# Patient Record
Sex: Female | Born: 1945 | Race: White | Hispanic: No | Marital: Single | Smoking: Never smoker
Health system: Southern US, Community
[De-identification: ages and names within clinical notes are randomized; demographics above are authoritative.]

## PROBLEM LIST (undated history)

## (undated) DIAGNOSIS — I499 Cardiac arrhythmia, unspecified: Secondary | ICD-10-CM

## (undated) DIAGNOSIS — K219 Gastro-esophageal reflux disease without esophagitis: Secondary | ICD-10-CM

## (undated) DIAGNOSIS — M810 Age-related osteoporosis without current pathological fracture: Secondary | ICD-10-CM

## (undated) HISTORY — PX: TONSILLECTOMY: SUR1361

## (undated) HISTORY — PX: COLONOSCOPY: SHX174

## (undated) HISTORY — PX: EYE SURGERY: SHX253

---

## 2004-07-26 ENCOUNTER — Ambulatory Visit: Payer: Self-pay | Admitting: Internal Medicine

## 2004-08-13 ENCOUNTER — Ambulatory Visit: Payer: Self-pay | Admitting: Internal Medicine

## 2005-08-14 ENCOUNTER — Ambulatory Visit: Payer: Self-pay | Admitting: Internal Medicine

## 2005-08-26 ENCOUNTER — Ambulatory Visit: Payer: Self-pay | Admitting: Internal Medicine

## 2006-08-28 ENCOUNTER — Ambulatory Visit: Payer: Self-pay | Admitting: Internal Medicine

## 2006-09-25 ENCOUNTER — Ambulatory Visit: Payer: Self-pay | Admitting: Gastroenterology

## 2007-08-31 ENCOUNTER — Ambulatory Visit: Payer: Self-pay | Admitting: Internal Medicine

## 2008-09-02 ENCOUNTER — Ambulatory Visit: Payer: Self-pay | Admitting: Internal Medicine

## 2008-10-05 ENCOUNTER — Ambulatory Visit: Payer: Self-pay | Admitting: Internal Medicine

## 2009-09-04 ENCOUNTER — Ambulatory Visit: Payer: Self-pay | Admitting: Internal Medicine

## 2009-09-07 ENCOUNTER — Ambulatory Visit: Payer: Self-pay | Admitting: Internal Medicine

## 2010-09-12 ENCOUNTER — Ambulatory Visit: Payer: Self-pay | Admitting: Internal Medicine

## 2011-09-13 ENCOUNTER — Ambulatory Visit: Payer: Self-pay | Admitting: Internal Medicine

## 2012-09-14 ENCOUNTER — Ambulatory Visit: Payer: Self-pay | Admitting: Internal Medicine

## 2013-09-20 ENCOUNTER — Ambulatory Visit: Payer: Self-pay | Admitting: Family Medicine

## 2014-09-27 ENCOUNTER — Other Ambulatory Visit: Payer: Self-pay | Admitting: Family Medicine

## 2014-09-27 DIAGNOSIS — Z1231 Encounter for screening mammogram for malignant neoplasm of breast: Secondary | ICD-10-CM

## 2014-10-03 ENCOUNTER — Ambulatory Visit
Admission: RE | Admit: 2014-10-03 | Discharge: 2014-10-03 | Disposition: A | Discharge status: Home / Self Care | Payer: Medicare Other | Source: Ambulatory Visit | Attending: Family Medicine | Admitting: Family Medicine

## 2014-10-03 ENCOUNTER — Other Ambulatory Visit: Payer: Self-pay | Admitting: Family Medicine

## 2014-10-03 DIAGNOSIS — Z1231 Encounter for screening mammogram for malignant neoplasm of breast: Secondary | ICD-10-CM | POA: Diagnosis not present

## 2015-08-25 ENCOUNTER — Other Ambulatory Visit: Payer: Self-pay | Admitting: Family Medicine

## 2015-08-25 DIAGNOSIS — Z1231 Encounter for screening mammogram for malignant neoplasm of breast: Secondary | ICD-10-CM

## 2015-10-04 ENCOUNTER — Ambulatory Visit
Admission: RE | Admit: 2015-10-04 | Discharge: 2015-10-04 | Disposition: A | Discharge status: Home / Self Care | Payer: Medicare Other | Source: Ambulatory Visit | Attending: Family Medicine | Admitting: Family Medicine

## 2015-10-04 ENCOUNTER — Ambulatory Visit: Payer: Medicare Other

## 2015-10-04 ENCOUNTER — Other Ambulatory Visit: Payer: Self-pay | Admitting: Family Medicine

## 2015-10-04 DIAGNOSIS — Z1231 Encounter for screening mammogram for malignant neoplasm of breast: Secondary | ICD-10-CM

## 2015-10-04 DIAGNOSIS — R928 Other abnormal and inconclusive findings on diagnostic imaging of breast: Secondary | ICD-10-CM | POA: Diagnosis not present

## 2015-10-05 ENCOUNTER — Other Ambulatory Visit: Payer: Self-pay | Admitting: Family Medicine

## 2015-10-05 DIAGNOSIS — N6320 Unspecified lump in the left breast, unspecified quadrant: Secondary | ICD-10-CM

## 2015-10-17 ENCOUNTER — Ambulatory Visit
Admission: RE | Admit: 2015-10-17 | Discharge: 2015-10-17 | Disposition: A | Discharge status: Home / Self Care | Payer: Medicare Other | Source: Ambulatory Visit | Attending: Family Medicine | Admitting: Family Medicine

## 2015-10-17 DIAGNOSIS — N63 Unspecified lump in breast: Secondary | ICD-10-CM | POA: Insufficient documentation

## 2015-10-17 DIAGNOSIS — N6320 Unspecified lump in the left breast, unspecified quadrant: Secondary | ICD-10-CM

## 2015-10-19 ENCOUNTER — Other Ambulatory Visit: Payer: Medicare Other

## 2015-10-19 ENCOUNTER — Ambulatory Visit: Payer: Medicare Other

## 2016-08-02 NOTE — Discharge Instructions (Signed)

## 2016-08-06 ENCOUNTER — Ambulatory Visit
Admission: RE | Admit: 2016-08-06 | Discharge: 2016-08-06 | Disposition: A | Discharge status: Home / Self Care | Payer: Medicare Other | Source: Ambulatory Visit | Attending: Ophthalmology | Admitting: Ophthalmology

## 2016-08-06 ENCOUNTER — Encounter
Admission: RE | Disposition: A | Discharge status: Home / Self Care | Payer: Self-pay | Source: Ambulatory Visit | Attending: Ophthalmology

## 2016-08-06 ENCOUNTER — Ambulatory Visit: Payer: Medicare Other | Admitting: Anesthesiology

## 2016-08-06 DIAGNOSIS — Z7982 Long term (current) use of aspirin: Secondary | ICD-10-CM | POA: Insufficient documentation

## 2016-08-06 DIAGNOSIS — Z79899 Other long term (current) drug therapy: Secondary | ICD-10-CM | POA: Diagnosis not present

## 2016-08-06 DIAGNOSIS — K219 Gastro-esophageal reflux disease without esophagitis: Secondary | ICD-10-CM | POA: Insufficient documentation

## 2016-08-06 DIAGNOSIS — H2511 Age-related nuclear cataract, right eye: Secondary | ICD-10-CM | POA: Diagnosis present

## 2016-08-06 HISTORY — DX: Age-related osteoporosis without current pathological fracture: M81.0

## 2016-08-06 HISTORY — PX: CATARACT EXTRACTION W/PHACO: SHX586

## 2016-08-06 HISTORY — DX: Gastro-esophageal reflux disease without esophagitis: K21.9

## 2016-08-06 HISTORY — DX: Cardiac arrhythmia, unspecified: I49.9

## 2016-08-06 SURGERY — PHACOEMULSIFICATION, CATARACT, WITH IOL INSERTION
Anesthesia: Monitor Anesthesia Care | Laterality: Right | Wound class: Clean

## 2016-08-06 MED ORDER — ONDANSETRON HCL 4 MG/2ML IJ SOLN: 4.0000 mg | Freq: Once | INTRAMUSCULAR | Status: DC | PRN

## 2016-08-06 MED ORDER — ACETAMINOPHEN 160 MG/5ML PO SOLN: 325.0000 mg | ORAL | Status: DC | PRN

## 2016-08-06 MED ORDER — BALANCED SALT IO SOLN
INTRAOCULAR | Status: DC | PRN
  Administered 2016-08-06: 1 mL via INTRAOCULAR

## 2016-08-06 MED ORDER — SODIUM HYALURONATE 23 MG/ML IO SOLN
INTRAOCULAR | Status: DC | PRN
  Administered 2016-08-06: 0.6 mL via INTRAOCULAR

## 2016-08-06 MED ORDER — ACETAMINOPHEN 325 MG PO TABS: 325.0000 mg | ORAL_TABLET | ORAL | Status: DC | PRN

## 2016-08-06 MED ORDER — MOXIFLOXACIN HCL 0.5 % OP SOLN
OPHTHALMIC | Status: DC | PRN
  Administered 2016-08-06: 0.2 mL via OPHTHALMIC

## 2016-08-06 MED ORDER — SODIUM HYALURONATE 10 MG/ML IO SOLN
INTRAOCULAR | Status: DC | PRN
  Administered 2016-08-06: 0.55 mL via INTRAOCULAR

## 2016-08-06 MED ORDER — MIDAZOLAM HCL 2 MG/2ML IJ SOLN
INTRAMUSCULAR | Status: DC | PRN
  Administered 2016-08-06: 2 mg via INTRAVENOUS

## 2016-08-06 MED ORDER — EPINEPHRINE PF 1 MG/ML IJ SOLN
INTRAOCULAR | Status: DC | PRN
  Administered 2016-08-06: 73 mL via OPHTHALMIC

## 2016-08-06 MED ORDER — ARMC OPHTHALMIC DILATING DROPS
1.0000 "application " | OPHTHALMIC | Status: DC | PRN
  Administered 2016-08-06 (×3): 1 via OPHTHALMIC

## 2016-08-06 MED ORDER — FENTANYL CITRATE (PF) 100 MCG/2ML IJ SOLN
INTRAMUSCULAR | Status: DC | PRN
  Administered 2016-08-06: 50 ug via INTRAVENOUS

## 2016-08-06 SURGICAL SUPPLY — 16 items
CANNULA ANT/CHMB 27GA (MISCELLANEOUS) ×3 IMPLANT
DISSECTOR HYDRO NUCLEUS 50X22 (MISCELLANEOUS) ×3 IMPLANT
GLOVE BIO SURGEON STRL SZ8 (GLOVE) ×3 IMPLANT
GLOVE SURG LX 7.5 STRW (GLOVE) ×2
GLOVE SURG LX STRL 7.5 STRW (GLOVE) ×1 IMPLANT
GOWN STRL REUS W/ TWL LRG LVL3 (GOWN DISPOSABLE) ×2 IMPLANT
GOWN STRL REUS W/TWL LRG LVL3 (GOWN DISPOSABLE) ×4
LENS IOL TECNIS ITEC 15.0 (Intraocular Lens) ×3 IMPLANT
MARKER SKIN DUAL TIP RULER LAB (MISCELLANEOUS) ×3 IMPLANT
PACK CATARACT (MISCELLANEOUS) ×3 IMPLANT
PACK DR. KING ARMS (PACKS) ×3 IMPLANT
PACK EYE AFTER SURG (MISCELLANEOUS) ×3 IMPLANT
SYR 3ML LL SCALE MARK (SYRINGE) ×3 IMPLANT
SYR TB 1ML LUER SLIP (SYRINGE) ×3 IMPLANT
WATER STERILE IRR 500ML POUR (IV SOLUTION) ×3 IMPLANT
WIPE NON LINTING 3.25X3.25 (MISCELLANEOUS) ×3 IMPLANT

## 2016-08-06 NOTE — Op Note (Signed)
OPERATIVE NOTE  Jackie Baker [id number] 08/06/2016   PREOPERATIVE DIAGNOSIS:  Nuclear sclerotic cataract right eye.  H25.11   POSTOPERATIVE DIAGNOSIS:    Nuclear sclerotic cataract right eye.     PROCEDURE:  Phacoemusification with posterior chamber intraocular lens placement of the right eye   LENS:   Implant Name Type Inv. Item Serial No. Manufacturer Lot No. LRB No. Used  LENS IOL DIOP 15.0 - [id number] Intraocular Lens LENS IOL DIOP 15.0 [id number] AMO   Right 1       PCB00 +15.0   ULTRASOUND TIME: 0 minutes 18 seconds.  CDE 2.41   SURGEON:  Benay Pillow, MD, MPH  ANESTHESIOLOGIST: Anesthesiologist: Veda Canning, MD CRNA: Londell Moh, CRNA   ANESTHESIA:  Topical with tetracaine drops augmented with 1% preservative-free intracameral lidocaine.  ESTIMATED BLOOD LOSS: less than 1 mL.   COMPLICATIONS:  None.   DESCRIPTION OF PROCEDURE:  The patient was identified in the holding room and transported to the operating room and placed in the supine position under the operating microscope.  The right eye was identified as the operative eye and it was prepped and draped in the usual sterile ophthalmic fashion.   A 1.0 millimeter clear-corneal paracentesis was made at the 10:30 position. 0.5 ml of preservative-free 1% lidocaine with epinephrine was injected into the anterior chamber.  The anterior chamber was filled with Healon 5 viscoelastic.  A 2.4 millimeter keratome was used to make a near-clear corneal incision at the 8:00 position.  A curvilinear capsulorrhexis was made with a cystotome and capsulorrhexis forceps.  Balanced salt solution was used to hydrodissect and hydrodelineate the nucleus.   Phacoemulsification was then used in stop and chop fashion to remove the lens nucleus and epinucleus.  The remaining cortex was then removed using the irrigation and aspiration handpiece. Healon was then placed into the capsular bag to distend it for lens placement.  A lens was then  injected into the capsular bag.  The remaining viscoelastic was aspirated.   Wounds were hydrated with balanced salt solution.  The anterior chamber was inflated to a physiologic pressure with balanced salt solution.   Intracameral vigamox 0.1 mL undiluted was injected into the eye and a drop placed onto the ocular surface.  No wound leaks were noted.  The patient was taken to the recovery room in stable condition without complications of anesthesia or surgery  Benay Pillow 08/06/2016, 8:02 AM

## 2016-08-06 NOTE — H&P (Signed)
The History and Physical notes are on paper, have been signed, and are to be scanned.   I have examined the patient and there are no changes to the H&P.   Benay Pillow 08/06/2016 7:17 AM

## 2016-08-06 NOTE — Transfer of Care (Signed)
Immediate Anesthesia Transfer of Care Note  Patient: Jackie Baker  Procedure(s) Performed: Procedure(s): CATARACT EXTRACTION PHACO AND INTRAOCULAR LENS PLACEMENT (IOC)  Right (Right)  Patient Location: PACU  Anesthesia Type: MAC  Level of Consciousness: awake, alert  and patient cooperative  Airway and Oxygen Therapy: Patient Spontanous Breathing and Patient connected to supplemental oxygen  Post-op Assessment: Post-op Vital signs reviewed, Patient's Cardiovascular Status Stable, Respiratory Function Stable, Patent Airway and No signs of Nausea or vomiting  Post-op Vital Signs: Reviewed and stable  Complications: No apparent anesthesia complications

## 2016-08-06 NOTE — Anesthesia Postprocedure Evaluation (Signed)
Anesthesia Post Note  Patient: Jackie Baker  Procedure(s) Performed: Procedure(s) (LRB): CATARACT EXTRACTION PHACO AND INTRAOCULAR LENS PLACEMENT (IOC)  Right (Right)  Patient location during evaluation: PACU Anesthesia Type: MAC Level of consciousness: awake and alert Pain management: pain level controlled Vital Signs Assessment: post-procedure vital signs reviewed and stable Respiratory status: spontaneous breathing, nonlabored ventilation and respiratory function stable Cardiovascular status: stable Postop Assessment: no signs of nausea or vomiting Anesthetic complications: no    Veda Canning

## 2016-08-06 NOTE — Anesthesia Procedure Notes (Signed)
Procedure Name: MAC Performed by: Abrina Petz Pre-anesthesia Checklist: Patient identified, Emergency Drugs available, Suction available, Timeout performed and Patient being monitored Patient Re-evaluated:Patient Re-evaluated prior to inductionOxygen Delivery Method: Nasal cannula Placement Confirmation: positive ETCO2     

## 2016-08-06 NOTE — Anesthesia Preprocedure Evaluation (Signed)
Anesthesia Evaluation  Patient identified by MRN, date of birth, ID band Patient awake    Reviewed: Allergy & Precautions, NPO status   Airway Mallampati: II  TM Distance: >3 FB     Dental no notable dental hx.    Pulmonary neg pulmonary ROS, neg sleep apnea,    breath sounds clear to auscultation       Cardiovascular (-) angina(-) Past MI negative cardio ROS   Rhythm:regular Rate:Normal     Neuro/Psych    GI/Hepatic GERD  Controlled,  Endo/Other    Renal/GU      Musculoskeletal Osteoporosis   Abdominal   Peds  Hematology   Anesthesia Other Findings   Reproductive/Obstetrics                             Anesthesia Physical Anesthesia Plan  ASA: II  Anesthesia Plan: MAC   Post-op Pain Management:    Induction:   PONV Risk Score and Plan:   Airway Management Planned:   Additional Equipment:   Intra-op Plan:   Post-operative Plan:   Informed Consent: I have reviewed the patients History and Physical, chart, labs and discussed the procedure including the risks, benefits and alternatives for the proposed anesthesia with the patient or authorized representative who has indicated his/her understanding and acceptance.     Plan Discussed with: CRNA  Anesthesia Plan Comments:         Anesthesia Quick Evaluation

## 2016-08-07 ENCOUNTER — Encounter: Payer: Self-pay | Admitting: Ophthalmology

## 2016-08-27 NOTE — Discharge Instructions (Signed)

## 2016-08-30 IMAGING — MG MM SCREENING BREAST TOMO BILATERAL
9 of 12 series · 9 of 28 positions shown · non-contrast
Comparison: Previous exam(s).

CLINICAL DATA: Screening.

EXAM:
DIGITAL SCREENING BILATERAL MAMMOGRAM WITH 3D TOMO WITH CAD

[L CC synth-2D]
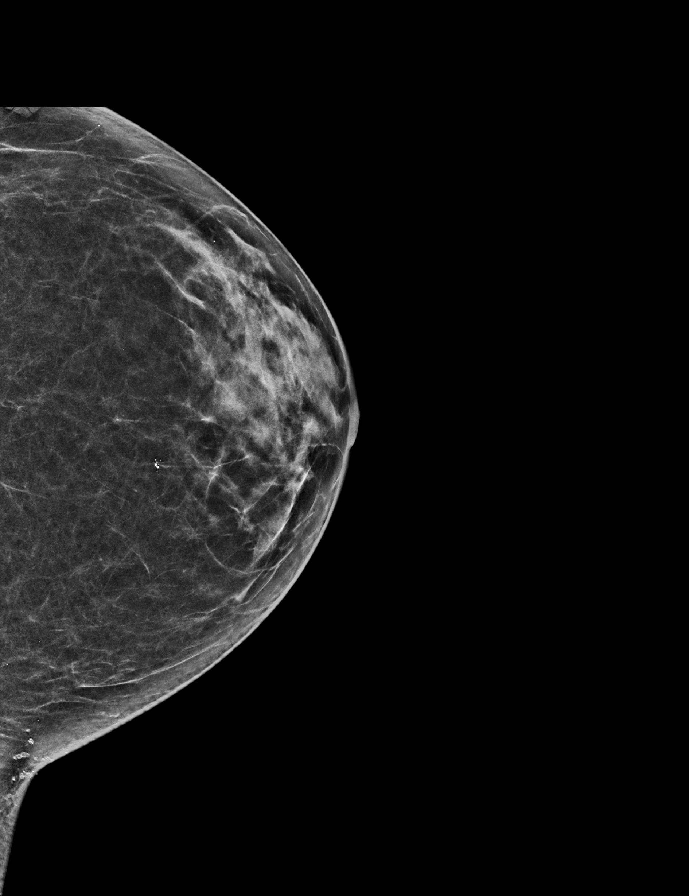

[R CC synth-2D]
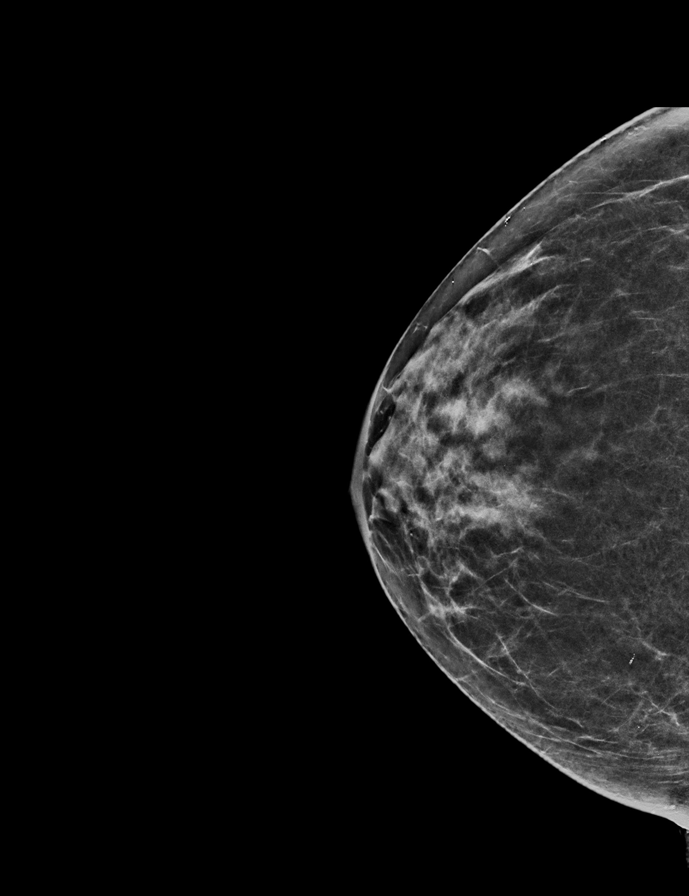

[R CC]
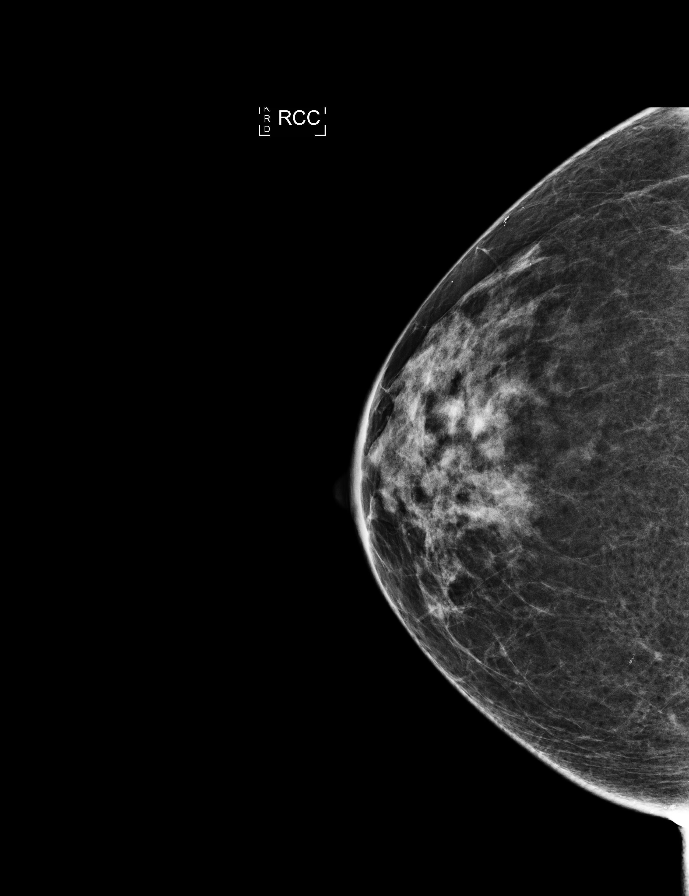

[L MLO synth-2D]
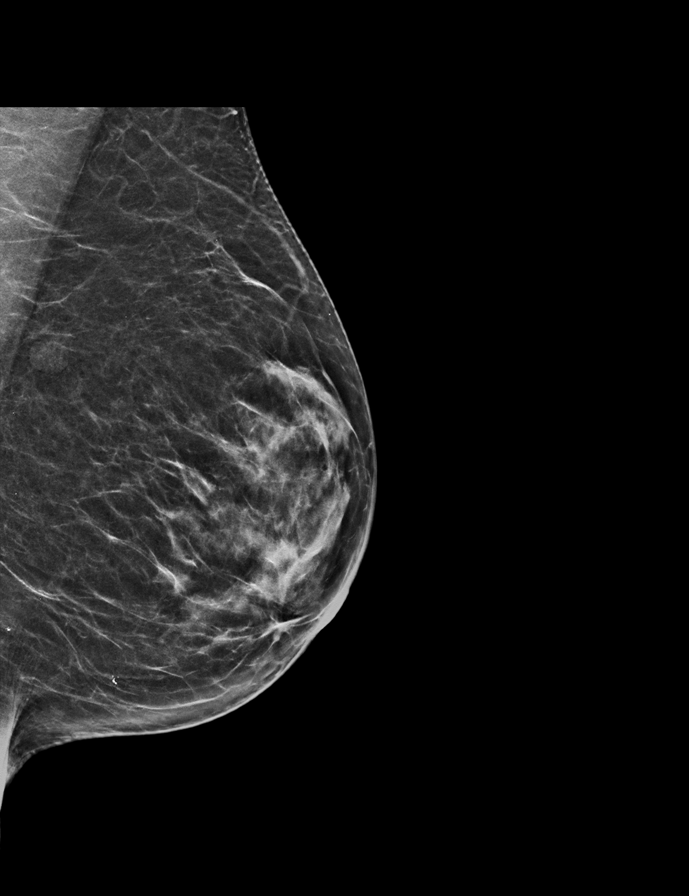

[R MLO synth-2D]
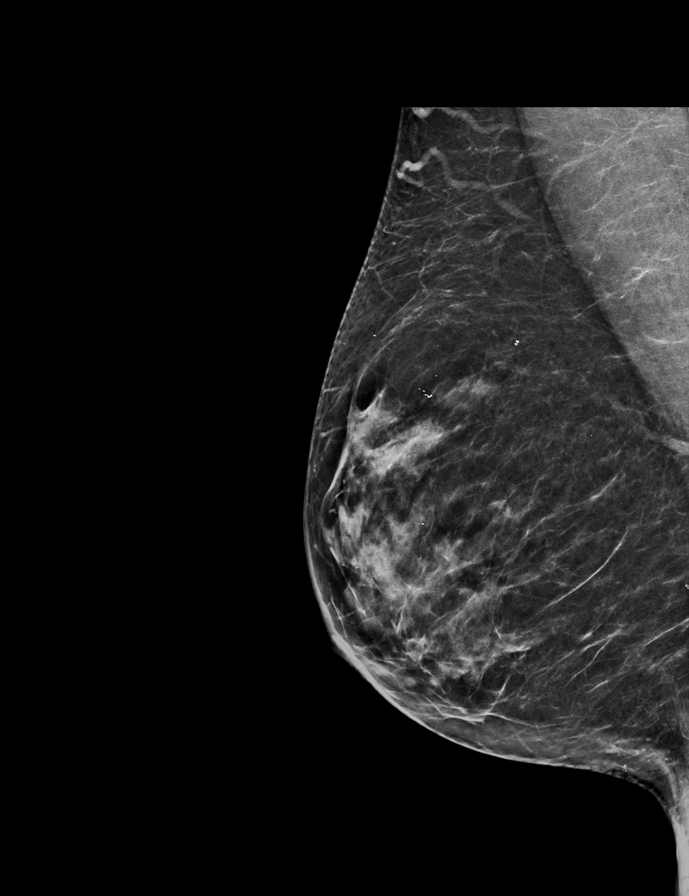

[L MLO]
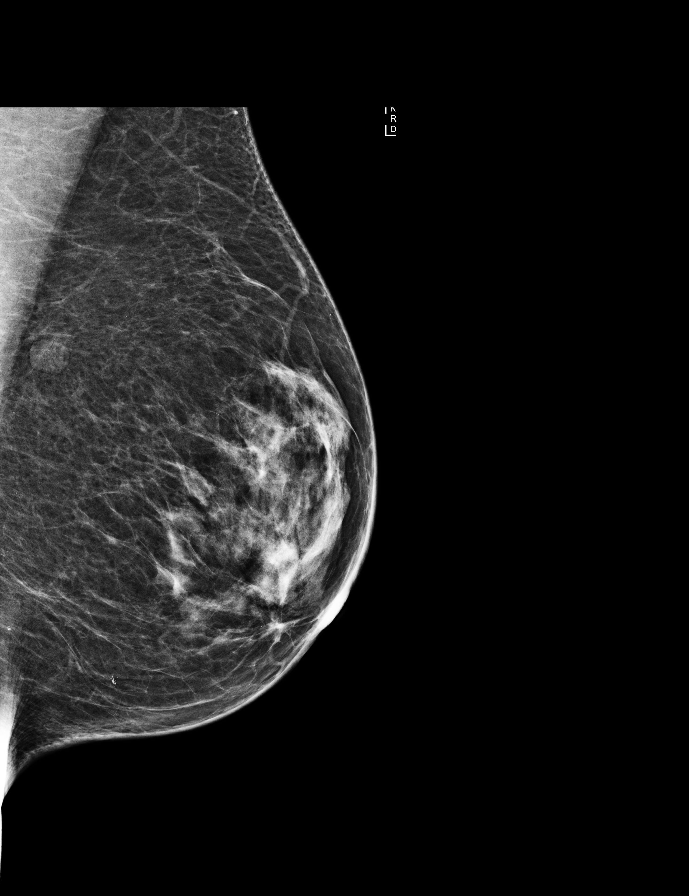

[L CC]
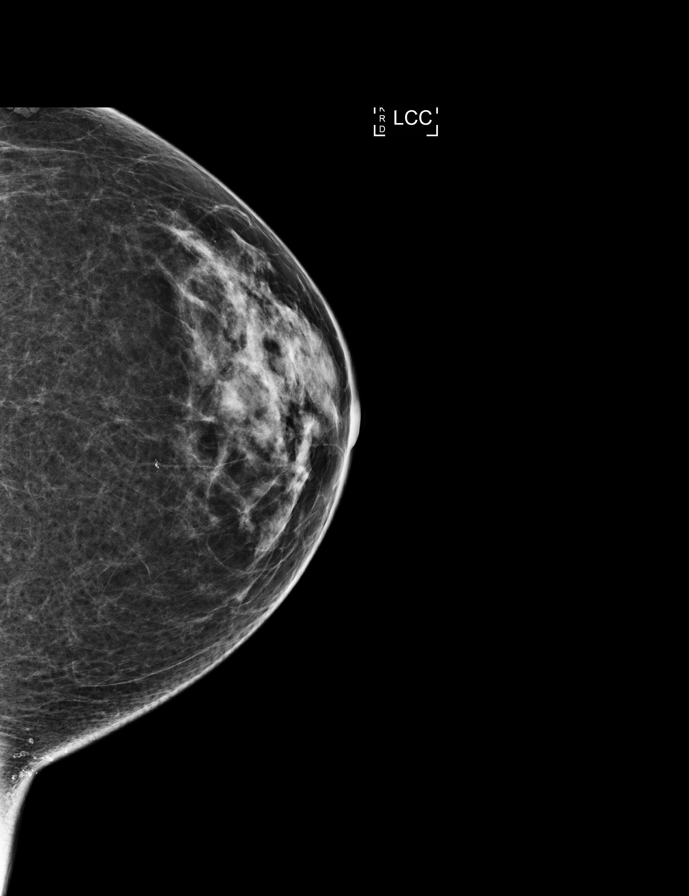

[R MLO]
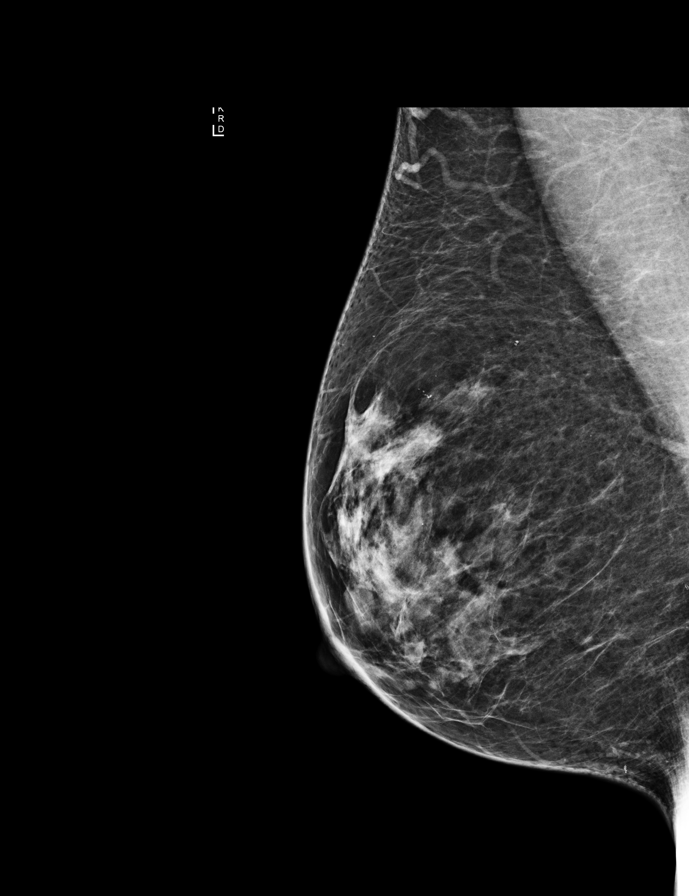

[L MLO tomo · tomo slice 29/57.0]
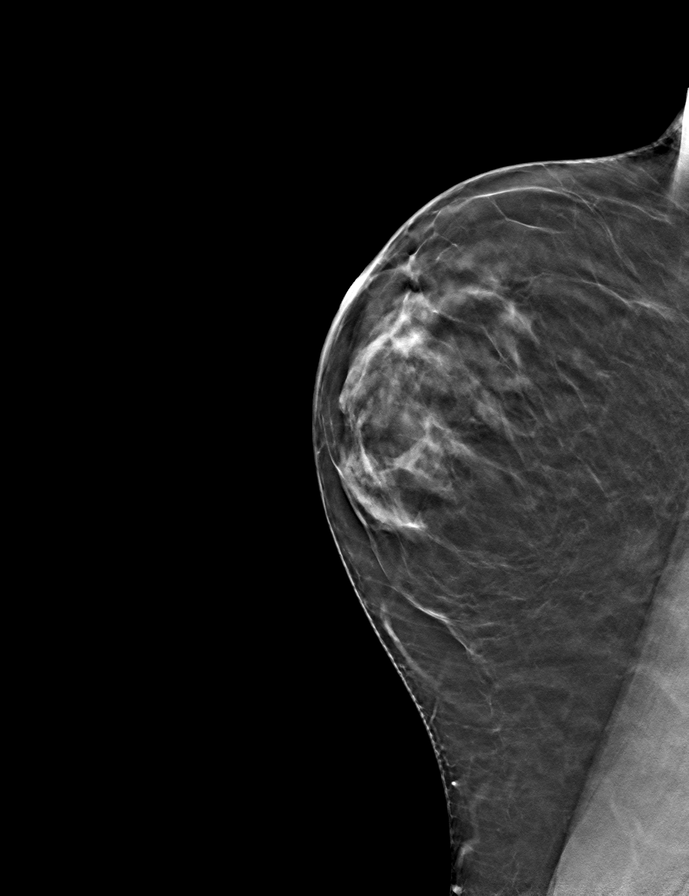

[9 of 28 positions shown; findings below may reference images not displayed]

ACR Breast Density Category b: There are scattered areas of
fibroglandular density.
FINDINGS: There are no findings suspicious for malignancy. Images were
processed with CAD.
IMPRESSION: No mammographic evidence of malignancy. A result letter of this
screening mammogram will be mailed directly to the patient.

RECOMMENDATION:
Screening mammogram in one year. (Code:55-L-23V)

BI-RADS CATEGORY  1: Negative.

## 2016-09-03 ENCOUNTER — Ambulatory Visit
Admission: RE | Admit: 2016-09-03 | Discharge: 2016-09-03 | Disposition: A | Discharge status: Home / Self Care | Payer: Medicare Other | Source: Ambulatory Visit | Attending: Ophthalmology | Admitting: Ophthalmology

## 2016-09-03 ENCOUNTER — Ambulatory Visit: Payer: Medicare Other | Admitting: Anesthesiology

## 2016-09-03 ENCOUNTER — Encounter
Admission: RE | Disposition: A | Discharge status: Home / Self Care | Payer: Self-pay | Source: Ambulatory Visit | Attending: Ophthalmology

## 2016-09-03 DIAGNOSIS — K219 Gastro-esophageal reflux disease without esophagitis: Secondary | ICD-10-CM | POA: Diagnosis not present

## 2016-09-03 DIAGNOSIS — M810 Age-related osteoporosis without current pathological fracture: Secondary | ICD-10-CM | POA: Diagnosis not present

## 2016-09-03 DIAGNOSIS — H2512 Age-related nuclear cataract, left eye: Secondary | ICD-10-CM | POA: Diagnosis present

## 2016-09-03 HISTORY — PX: CATARACT EXTRACTION W/PHACO: SHX586

## 2016-09-03 SURGERY — PHACOEMULSIFICATION, CATARACT, WITH IOL INSERTION
Anesthesia: Monitor Anesthesia Care | Laterality: Left | Wound class: Clean

## 2016-09-03 MED ORDER — LACTATED RINGERS IV SOLN: 1000.0000 mL | INTRAVENOUS | Status: DC

## 2016-09-03 MED ORDER — MIDAZOLAM HCL 2 MG/2ML IJ SOLN
INTRAMUSCULAR | Status: DC | PRN
  Administered 2016-09-03: 2 mg via INTRAVENOUS

## 2016-09-03 MED ORDER — SODIUM HYALURONATE 10 MG/ML IO SOLN
INTRAOCULAR | Status: DC | PRN
  Administered 2016-09-03: 0.55 mL via INTRAOCULAR

## 2016-09-03 MED ORDER — MOXIFLOXACIN HCL 0.5 % OP SOLN
OPHTHALMIC | Status: DC | PRN
  Administered 2016-09-03: 0.2 mL via OPHTHALMIC

## 2016-09-03 MED ORDER — FENTANYL CITRATE (PF) 100 MCG/2ML IJ SOLN
INTRAMUSCULAR | Status: DC | PRN
  Administered 2016-09-03: 50 ug via INTRAVENOUS

## 2016-09-03 MED ORDER — LIDOCAINE HCL (PF) 2 % IJ SOLN
INTRAOCULAR | Status: DC | PRN
  Administered 2016-09-03: 1 mL via INTRAOCULAR

## 2016-09-03 MED ORDER — EPINEPHRINE PF 1 MG/ML IJ SOLN
INTRAOCULAR | Status: DC | PRN
  Administered 2016-09-03: 79 mL via OPHTHALMIC

## 2016-09-03 MED ORDER — ARMC OPHTHALMIC DILATING DROPS
1.0000 "application " | OPHTHALMIC | Status: DC | PRN
  Administered 2016-09-03 (×3): 1 via OPHTHALMIC

## 2016-09-03 MED ORDER — SODIUM HYALURONATE 23 MG/ML IO SOLN
INTRAOCULAR | Status: DC | PRN
  Administered 2016-09-03: 0.6 mL via INTRAOCULAR

## 2016-09-03 SURGICAL SUPPLY — 16 items

## 2016-09-03 NOTE — Anesthesia Preprocedure Evaluation (Addendum)
Anesthesia Evaluation  Patient identified by MRN, date of birth, ID band Patient awake    Reviewed: Allergy & Precautions, NPO status , Patient's Chart, lab work & pertinent test results, reviewed documented beta blocker date and time   Airway Mallampati: I  TM Distance: >3 FB Neck ROM: Full    Dental no notable dental hx.    Pulmonary    Pulmonary exam normal breath sounds clear to auscultation       Cardiovascular negative cardio ROS Normal cardiovascular exam Rhythm:Regular Rate:Normal     Neuro/Psych negative neurological ROS  negative psych ROS   GI/Hepatic Neg liver ROS, GERD  ,  Endo/Other  negative endocrine ROS  Renal/GU negative Renal ROS  negative genitourinary   Musculoskeletal Osteoporosis   Abdominal Normal abdominal exam  (+)  Abdomen: soft.    Peds  Hematology negative hematology ROS (+)   Anesthesia Other Findings   Reproductive/Obstetrics                            Anesthesia Physical Anesthesia Plan  ASA: II  Anesthesia Plan: MAC   Post-op Pain Management:    Induction:   PONV Risk Score and Plan:   Airway Management Planned: Nasal Cannula  Additional Equipment: None  Intra-op Plan:   Post-operative Plan:   Informed Consent: I have reviewed the patients History and Physical, chart, labs and discussed the procedure including the risks, benefits and alternatives for the proposed anesthesia with the patient or authorized representative who has indicated his/her understanding and acceptance.     Plan Discussed with: CRNA, Anesthesiologist and Surgeon  Anesthesia Plan Comments:         Anesthesia Quick Evaluation

## 2016-09-03 NOTE — Anesthesia Postprocedure Evaluation (Signed)
Anesthesia Post Note  Patient: Jackie Baker  Procedure(s) Performed: Procedure(s) (LRB): CATARACT EXTRACTION PHACO AND INTRAOCULAR LENS PLACEMENT (IOC) Left (Left)  Patient location during evaluation: PACU Anesthesia Type: MAC Level of consciousness: awake Pain management: pain level controlled Vital Signs Assessment: post-procedure vital signs reviewed and stable Respiratory status: spontaneous breathing Cardiovascular status: blood pressure returned to baseline Postop Assessment: no headache Anesthetic complications: no    Lavonna Monarch

## 2016-09-03 NOTE — Transfer of Care (Signed)
Immediate Anesthesia Transfer of Care Note  Patient: Jackie Baker  Procedure(s) Performed: Procedure(s): CATARACT EXTRACTION PHACO AND INTRAOCULAR LENS PLACEMENT (IOC) Left (Left)  Patient Location: PACU  Anesthesia Type: MAC  Level of Consciousness: awake, alert  and patient cooperative  Airway and Oxygen Therapy: Patient Spontanous Breathing and Patient connected to supplemental oxygen  Post-op Assessment: Post-op Vital signs reviewed, Patient's Cardiovascular Status Stable, Respiratory Function Stable, Patent Airway and No signs of Nausea or vomiting  Post-op Vital Signs: Reviewed and stable  Complications: No apparent anesthesia complications

## 2016-09-03 NOTE — Op Note (Signed)
OPERATIVE NOTE  Jackie Baker [id number] 09/03/2016   PREOPERATIVE DIAGNOSIS:  Nuclear sclerotic cataract left eye.  H25.12   POSTOPERATIVE DIAGNOSIS:    Nuclear sclerotic cataract left eye.     PROCEDURE:  Phacoemusification with posterior chamber intraocular lens placement of the left eye   LENS:   Implant Name Type Inv. Item Serial No. Manufacturer Lot No. LRB No. Used  LENS IOL DIOP 17.0 - [id number] Intraocular Lens LENS IOL DIOP 17.0 [id number] AMO   Left 1       PCB00 +17.0   ULTRASOUND TIME: 0 minutes 24.0 seconds.  CDE 4.05   SURGEON:  Benay Pillow, MD, MPH   ANESTHESIA:  Topical with tetracaine drops augmented with 1% preservative-free intracameral lidocaine.  ESTIMATED BLOOD LOSS: <1 mL   COMPLICATIONS:  None.   DESCRIPTION OF PROCEDURE:  The patient was identified in the holding room and transported to the operating room and placed in the supine position under the operating microscope.  The left eye was identified as the operative eye and it was prepped and draped in the usual sterile ophthalmic fashion.   A 1.0 millimeter clear-corneal paracentesis was made at the 5:00 position. 0.5 ml of preservative-free 1% lidocaine with epinephrine was injected into the anterior chamber.  The anterior chamber was filled with Healon 5 viscoelastic.  A 2.4 millimeter keratome was used to make a near-clear corneal incision at the 2:00 position.  A curvilinear capsulorrhexis was made with a cystotome and capsulorrhexis forceps.  Balanced salt solution was used to hydrodissect and hydrodelineate the nucleus.   Phacoemulsification was then used in stop and chop fashion to remove the lens nucleus and epinucleus.  The remaining cortex was then removed using the irrigation and aspiration handpiece. Healon was then placed into the capsular bag to distend it for lens placement.  A lens was then injected into the capsular bag.  The remaining viscoelastic was aspirated.   Wounds were  hydrated with balanced salt solution.  The anterior chamber was inflated to a physiologic pressure with balanced salt solution.  Intracameral vigamox 0.1 mL undiltued was injected into the eye and a drop placed onto the ocular surface.  No wound leaks were noted.  The patient was taken to the recovery room in stable condition without complications of anesthesia or surgery  Benay Pillow 09/03/2016, 8:42 AM

## 2016-09-03 NOTE — H&P (Signed)
The History and Physical notes are on paper, have been signed, and are to be scanned.   I have examined the patient and there are no changes to the H&P.   Benay Pillow 09/03/2016 8:11 AM

## 2016-09-03 NOTE — Anesthesia Procedure Notes (Signed)
Procedure Name: MAC Performed by: Lanae Federer Pre-anesthesia Checklist: Patient identified, Emergency Drugs available, Suction available, Timeout performed and Patient being monitored Patient Re-evaluated:Patient Re-evaluated prior to inductionOxygen Delivery Method: Nasal cannula Placement Confirmation: positive ETCO2     

## 2016-09-04 ENCOUNTER — Other Ambulatory Visit: Payer: Self-pay | Admitting: Family Medicine

## 2016-09-04 ENCOUNTER — Encounter: Payer: Self-pay | Admitting: Ophthalmology

## 2016-09-04 DIAGNOSIS — Z1231 Encounter for screening mammogram for malignant neoplasm of breast: Secondary | ICD-10-CM

## 2016-10-17 ENCOUNTER — Ambulatory Visit
Admission: RE | Admit: 2016-10-17 | Discharge: 2016-10-17 | Disposition: A | Discharge status: Home / Self Care | Payer: Medicare Other | Source: Ambulatory Visit | Attending: Family Medicine | Admitting: Family Medicine

## 2016-10-17 DIAGNOSIS — Z1231 Encounter for screening mammogram for malignant neoplasm of breast: Secondary | ICD-10-CM | POA: Insufficient documentation

## 2017-07-22 ENCOUNTER — Ambulatory Visit
Admission: RE | Admit: 2017-07-22 | Discharge: 2017-07-22 | Disposition: A | Discharge status: Home / Self Care | Payer: Medicare Other | Source: Ambulatory Visit | Attending: Gastroenterology | Admitting: Gastroenterology

## 2017-07-22 ENCOUNTER — Ambulatory Visit: Payer: Medicare Other | Admitting: Anesthesiology

## 2017-07-22 ENCOUNTER — Encounter: Payer: Self-pay | Admitting: Anesthesiology

## 2017-07-22 ENCOUNTER — Encounter
Admission: RE | Disposition: A | Discharge status: Home / Self Care | Payer: Self-pay | Source: Ambulatory Visit | Attending: Gastroenterology

## 2017-07-22 DIAGNOSIS — K5730 Diverticulosis of large intestine without perforation or abscess without bleeding: Secondary | ICD-10-CM | POA: Insufficient documentation

## 2017-07-22 DIAGNOSIS — I493 Ventricular premature depolarization: Secondary | ICD-10-CM | POA: Diagnosis not present

## 2017-07-22 DIAGNOSIS — Z79899 Other long term (current) drug therapy: Secondary | ICD-10-CM | POA: Insufficient documentation

## 2017-07-22 DIAGNOSIS — D123 Benign neoplasm of transverse colon: Secondary | ICD-10-CM | POA: Diagnosis not present

## 2017-07-22 DIAGNOSIS — Z7982 Long term (current) use of aspirin: Secondary | ICD-10-CM | POA: Insufficient documentation

## 2017-07-22 DIAGNOSIS — Z1211 Encounter for screening for malignant neoplasm of colon: Secondary | ICD-10-CM | POA: Diagnosis present

## 2017-07-22 DIAGNOSIS — K219 Gastro-esophageal reflux disease without esophagitis: Secondary | ICD-10-CM | POA: Insufficient documentation

## 2017-07-22 HISTORY — PX: COLONOSCOPY WITH PROPOFOL: SHX5780

## 2017-07-22 SURGERY — COLONOSCOPY WITH PROPOFOL
Anesthesia: General

## 2017-07-22 MED ORDER — PROPOFOL 10 MG/ML IV BOLUS
INTRAVENOUS | Status: AC
  Filled 2017-07-22: qty 20

## 2017-07-22 MED ORDER — PROPOFOL 500 MG/50ML IV EMUL
INTRAVENOUS | Status: DC | PRN
  Administered 2017-07-22: 150 ug/kg/min via INTRAVENOUS

## 2017-07-22 MED ORDER — LIDOCAINE HCL (CARDIAC) PF 100 MG/5ML IV SOSY
PREFILLED_SYRINGE | INTRAVENOUS | Status: DC | PRN
  Administered 2017-07-22: 100 mg via INTRAVENOUS

## 2017-07-22 MED ORDER — SODIUM CHLORIDE 0.9 % IV SOLN: INTRAVENOUS | Status: DC

## 2017-07-22 MED ORDER — PROPOFOL 10 MG/ML IV BOLUS
INTRAVENOUS | Status: DC | PRN
  Administered 2017-07-22: 100 mg via INTRAVENOUS

## 2017-07-22 MED ORDER — SODIUM CHLORIDE 0.9 % IV SOLN
INTRAVENOUS | Status: DC
  Administered 2017-07-22: 10:00:00 via INTRAVENOUS

## 2017-07-22 NOTE — H&P (Signed)
Outpatient short stay form Pre-procedure 07/22/2017 9:54 AM Lollie Sails MD  Primary Physician: Dr Derinda Late  Reason for visit: Colonoscopy  History of present illness: Patient is a 72 year old female presenting today as above.  Her last colonoscopy was in 2001 normal at that time.  She denies any problems with abdominal pain rectal bleeding or diarrhea.  She tolerated her prep well.  She takes no aspirin or thinner with the exception of a occasional 81 aspirin.    Current Facility-Administered Medications:  .  0.9 %  sodium chloride infusion, , Intravenous, Continuous, Lollie Sails, MD .  0.9 %  sodium chloride infusion, , Intravenous, Continuous, Lollie Sails, MD  Medications Prior to Admission  Medication Sig Dispense Refill Last Dose  . aspirin EC 81 MG tablet Take 81 mg by mouth daily.   Past Week at Unknown time  . Calcium Carb-Cholecalciferol (CALCIUM 1000 + D PO) Take by mouth.   Past Week at Unknown time  . ibandronate (BONIVA) 150 MG tablet Take 150 mg by mouth every 30 (thirty) days. Take in the morning with a full glass of water, on an empty stomach, and do not take anything else by mouth or lie down for the next 30 min.   Past Month at Unknown time  . Multiple Vitamins-Minerals (WOMENS MULTIVITAMIN PO) Take by mouth.   Past Week at Unknown time  . omeprazole (PRILOSEC) 40 MG capsule Take 40 mg by mouth daily.   Past Week at Unknown time  . raloxifene (EVISTA) 60 MG tablet Take 60 mg by mouth daily.   Not Taking at Unknown time     No Known Allergies   Past Medical History:  Diagnosis Date  . Arrhythmia    PVCs  . GERD (gastroesophageal reflux disease)   . Osteoporosis     Review of systems:      Physical Exam    Heart and lungs: Regular rate and rhythm without rub or gallop, lungs are bilaterally clear.    HEENT: Normocephalic atraumatic eyes are anicteric    Other:    Pertinant exam for procedure: Soft nontender nondistended bowel  sounds positive normoactive.    Planned proceedures: Colonoscopy and indicated procedures. I have discussed the risks benefits and complications of procedures to include not limited to bleeding, infection, perforation and the risk of sedation and the patient wishes to proceed.    Lollie Sails, MD Gastroenterology 07/22/2017  9:54 AM

## 2017-07-22 NOTE — Anesthesia Postprocedure Evaluation (Signed)
Anesthesia Post Note  Patient: Jackie Baker  Procedure(s) Performed: COLONOSCOPY WITH PROPOFOL (N/A )  Patient location during evaluation: Endoscopy Anesthesia Type: General Level of consciousness: awake and alert Pain management: pain level controlled Vital Signs Assessment: post-procedure vital signs reviewed and stable Respiratory status: spontaneous breathing, nonlabored ventilation, respiratory function stable and patient connected to nasal cannula oxygen Cardiovascular status: blood pressure returned to baseline and stable Postop Assessment: no apparent nausea or vomiting Anesthetic complications: no     Last Vitals:  Vitals:   07/22/17 1040 07/22/17 1050  BP: 120/73 126/82  Pulse: 68 (!) 59  Resp: 15 14  Temp:    SpO2: 100% 100%    Last Pain:  Vitals:   07/22/17 1030  TempSrc: Tympanic  PainSc:                  Sun Kihn S

## 2017-07-22 NOTE — Op Note (Addendum)
[facility] Gastroenterology Patient Name: Jackie Baker Procedure Date: 07/22/2017 9:51 AM MRN: [id number] Account #: [id number] Date of Birth: 01-12-1946 Admit Type: Outpatient Age: 72 Room: [facility] ENDO ROOM 2 Gender: Female Note Status: Finalized Procedure:            Colonoscopy Indications:          Screening for colorectal malignant neoplasm Providers:            Lollie Sails, MD Referring MD:         Caprice Renshaw MD (Referring MD) Medicines:            Monitored Anesthesia Care Complications:        No immediate complications. Procedure:            Pre-Anesthesia Assessment:                       - ASA Grade Assessment: II - A patient with mild                        systemic disease.                       After obtaining informed consent, the colonoscope was                        passed under direct vision. Throughout the procedure,                        the patient's blood pressure, pulse, and oxygen                        saturations were monitored continuously. The                        Colonoscope was introduced through the anus and                        advanced to the the cecum, identified by appendiceal                        orifice and ileocecal valve. The colonoscopy was                        performed without difficulty. The patient tolerated the                        procedure well. The quality of the bowel preparation                        was good. Findings:      A few medium-mouthed diverticula were found in the sigmoid colon,       descending colon, transverse colon and ascending colon.      A 4 mm polyp was found in the proximal transverse colon. The polyp was       sessile. The polyp was removed with a cold snare. Resection and       retrieval were complete.      A 6 mm polyp was found in the mid transverse colon. The polyp was       sessile. The polyp was removed with a cold snare. Resection and  retrieval were  complete.      A 3 mm polyp was found in the splenic flexure. The polyp was sessile.       The polyp was removed with a cold biopsy forceps. Resection and       retrieval were complete.      The digital rectal exam was normal. Impression:           - Diverticulosis in the sigmoid colon, in the                        descending colon, in the transverse colon and in the                        ascending colon.                       - One 4 mm polyp in the proximal transverse colon,                        removed with a cold snare. Resected and retrieved.                       - One 6 mm polyp in the mid transverse colon, removed                        with a cold snare. Resected and retrieved.                       - One 3 mm polyp at the splenic flexure, removed with a                        cold biopsy forceps. Resected and retrieved. Recommendation:       - Discharge patient to home.                       - Soft diet today, then advance as tolerated to advance                        diet as tolerated.                       - Await pathology results.                       - Telephone GI clinic for pathology results in 1 week. Procedure Code(s):    --- Professional ---                       [phone], Colonoscopy, flexible; with removal of tumor(s),                        polyp(s), or other lesion(s) by snare technique                       66063, 15, Colonoscopy, flexible; with biopsy, single                        or multiple Diagnosis Code(s):    --- Professional ---  Z12.11, Encounter for screening for malignant neoplasm                        of colon                       D12.3, Benign neoplasm of transverse colon (hepatic                        flexure or splenic flexure)                       K57.30, Diverticulosis of large intestine without                        perforation or abscess without bleeding CPT copyright 2017 [employer]. All rights  reserved. The codes documented in this report are preliminary and upon coder review may  be revised to meet current compliance requirements. Lollie Sails, MD 07/22/2017 10:31:41 AM This report has been signed electronically. Number of Addenda: 0 Note Initiated On: 07/22/2017 9:51 AM Scope Withdrawal Time: 0 hours 13 minutes 28 seconds  Total Procedure Duration: 0 hours 22 minutes 3 seconds       [facility]

## 2017-07-22 NOTE — Anesthesia Preprocedure Evaluation (Signed)
Anesthesia Evaluation  Patient identified by MRN, date of birth, ID band Patient awake    Reviewed: Allergy & Precautions, NPO status , Patient's Chart, lab work & pertinent test results, reviewed documented beta blocker date and time   Airway Mallampati: II  TM Distance: >3 FB     Dental  (+) Chipped   Pulmonary           Cardiovascular      Neuro/Psych    GI/Hepatic GERD  ,  Endo/Other    Renal/GU      Musculoskeletal   Abdominal   Peds  Hematology   Anesthesia Other Findings PVCs.  Reproductive/Obstetrics                             Anesthesia Physical Anesthesia Plan  ASA: II  Anesthesia Plan: General   Post-op Pain Management:    Induction: Intravenous  PONV Risk Score and Plan:   Airway Management Planned:   Additional Equipment:   Intra-op Plan:   Post-operative Plan:   Informed Consent: I have reviewed the patients History and Physical, chart, labs and discussed the procedure including the risks, benefits and alternatives for the proposed anesthesia with the patient or authorized representative who has indicated his/her understanding and acceptance.     Plan Discussed with: CRNA  Anesthesia Plan Comments:         Anesthesia Quick Evaluation

## 2017-07-22 NOTE — Anesthesia Post-op Follow-up Note (Signed)
Anesthesia QCDR form completed.        

## 2017-07-22 NOTE — Transfer of Care (Signed)
Immediate Anesthesia Transfer of Care Note  Patient: Jackie Baker  Procedure(s) Performed: COLONOSCOPY WITH PROPOFOL (N/A )  Patient Location: PACU  Anesthesia Type:General  Level of Consciousness: awake, alert  and oriented  Airway & Oxygen Therapy: Patient Spontanous Breathing  Post-op Assessment: Report given to RN and Post -op Vital signs reviewed and stable  Post vital signs: Reviewed and stable  Last Vitals:  Vitals Value Taken Time  BP    Temp    Pulse    Resp    SpO2      Last Pain:  Vitals:   07/22/17 0929  TempSrc: Tympanic  PainSc: 0-No pain         Complications: No apparent anesthesia complications

## 2017-07-23 ENCOUNTER — Encounter: Payer: Self-pay | Admitting: Gastroenterology

## 2017-07-23 LAB — SURGICAL PATHOLOGY

## 2017-09-15 ENCOUNTER — Other Ambulatory Visit: Payer: Self-pay | Admitting: Family Medicine

## 2017-09-15 DIAGNOSIS — Z1231 Encounter for screening mammogram for malignant neoplasm of breast: Secondary | ICD-10-CM

## 2017-10-22 ENCOUNTER — Ambulatory Visit
Admission: RE | Admit: 2017-10-22 | Discharge: 2017-10-22 | Disposition: A | Discharge status: Home / Self Care | Payer: Medicare Other | Source: Ambulatory Visit | Attending: Family Medicine | Admitting: Family Medicine

## 2017-10-22 DIAGNOSIS — Z1231 Encounter for screening mammogram for malignant neoplasm of breast: Secondary | ICD-10-CM | POA: Diagnosis not present

## 2018-09-17 ENCOUNTER — Other Ambulatory Visit: Payer: Self-pay | Admitting: Family Medicine

## 2018-09-17 DIAGNOSIS — Z1231 Encounter for screening mammogram for malignant neoplasm of breast: Secondary | ICD-10-CM

## 2018-10-26 ENCOUNTER — Ambulatory Visit
Admission: RE | Admit: 2018-10-26 | Discharge: 2018-10-26 | Disposition: A | Discharge status: Home / Self Care | Payer: Medicare Other | Source: Ambulatory Visit | Attending: Family Medicine | Admitting: Family Medicine

## 2018-10-26 DIAGNOSIS — Z1231 Encounter for screening mammogram for malignant neoplasm of breast: Secondary | ICD-10-CM | POA: Insufficient documentation

## 2019-09-13 ENCOUNTER — Other Ambulatory Visit: Payer: Self-pay | Admitting: Family Medicine

## 2019-09-20 ENCOUNTER — Other Ambulatory Visit: Payer: Self-pay | Admitting: Family Medicine

## 2019-09-20 DIAGNOSIS — Z1231 Encounter for screening mammogram for malignant neoplasm of breast: Secondary | ICD-10-CM

## 2019-10-27 ENCOUNTER — Other Ambulatory Visit: Payer: Self-pay

## 2019-10-27 ENCOUNTER — Ambulatory Visit
Admission: RE | Admit: 2019-10-27 | Discharge: 2019-10-27 | Disposition: A | Discharge status: Home / Self Care | Payer: Medicare Other | Source: Ambulatory Visit | Attending: Family Medicine | Admitting: Family Medicine

## 2019-10-27 DIAGNOSIS — Z1231 Encounter for screening mammogram for malignant neoplasm of breast: Secondary | ICD-10-CM | POA: Insufficient documentation

## 2020-09-25 ENCOUNTER — Other Ambulatory Visit: Payer: Self-pay | Admitting: Family Medicine

## 2020-09-25 DIAGNOSIS — Z1231 Encounter for screening mammogram for malignant neoplasm of breast: Secondary | ICD-10-CM

## 2020-10-27 ENCOUNTER — Other Ambulatory Visit: Payer: Self-pay

## 2020-10-27 ENCOUNTER — Ambulatory Visit
Admission: RE | Admit: 2020-10-27 | Discharge: 2020-10-27 | Disposition: A | Discharge status: Home / Self Care | Payer: Medicare Other | Source: Ambulatory Visit | Attending: Family Medicine | Admitting: Family Medicine

## 2020-10-27 DIAGNOSIS — Z1231 Encounter for screening mammogram for malignant neoplasm of breast: Secondary | ICD-10-CM | POA: Insufficient documentation

## 2021-10-02 ENCOUNTER — Other Ambulatory Visit: Payer: Self-pay | Admitting: Family Medicine

## 2021-10-02 DIAGNOSIS — Z1231 Encounter for screening mammogram for malignant neoplasm of breast: Secondary | ICD-10-CM

## 2021-10-30 ENCOUNTER — Ambulatory Visit
Admission: RE | Admit: 2021-10-30 | Discharge: 2021-10-30 | Disposition: A | Discharge status: Home / Self Care | Payer: Medicare Other | Source: Ambulatory Visit | Attending: Family Medicine | Admitting: Family Medicine

## 2021-10-30 DIAGNOSIS — Z1231 Encounter for screening mammogram for malignant neoplasm of breast: Secondary | ICD-10-CM | POA: Insufficient documentation

## 2022-03-05 ENCOUNTER — Ambulatory Visit: Payer: Medicare Other | Admitting: Anesthesiology

## 2022-03-05 ENCOUNTER — Encounter: Payer: Self-pay | Admitting: *Deleted

## 2022-03-05 ENCOUNTER — Ambulatory Visit
Admission: RE | Admit: 2022-03-05 | Discharge: 2022-03-05 | Disposition: A | Discharge status: Home / Self Care | Payer: Medicare Other | Source: Ambulatory Visit | Attending: Gastroenterology | Admitting: Gastroenterology

## 2022-03-05 ENCOUNTER — Encounter
Admission: RE | Disposition: A | Discharge status: Home / Self Care | Payer: Self-pay | Source: Ambulatory Visit | Attending: Gastroenterology

## 2022-03-05 DIAGNOSIS — Z1211 Encounter for screening for malignant neoplasm of colon: Secondary | ICD-10-CM | POA: Insufficient documentation

## 2022-03-05 DIAGNOSIS — K219 Gastro-esophageal reflux disease without esophagitis: Secondary | ICD-10-CM | POA: Diagnosis not present

## 2022-03-05 DIAGNOSIS — K5730 Diverticulosis of large intestine without perforation or abscess without bleeding: Secondary | ICD-10-CM | POA: Insufficient documentation

## 2022-03-05 DIAGNOSIS — D123 Benign neoplasm of transverse colon: Secondary | ICD-10-CM | POA: Insufficient documentation

## 2022-03-05 DIAGNOSIS — D1779 Benign lipomatous neoplasm of other sites: Secondary | ICD-10-CM | POA: Insufficient documentation

## 2022-03-05 DIAGNOSIS — Z86010 Personal history of colonic polyps: Secondary | ICD-10-CM | POA: Diagnosis present

## 2022-03-05 DIAGNOSIS — K640 First degree hemorrhoids: Secondary | ICD-10-CM | POA: Diagnosis not present

## 2022-03-05 HISTORY — PX: COLONOSCOPY WITH PROPOFOL: SHX5780

## 2022-03-05 SURGERY — COLONOSCOPY WITH PROPOFOL
Anesthesia: General

## 2022-03-05 MED ORDER — SODIUM CHLORIDE 0.9 % IV SOLN: INTRAVENOUS | Status: DC

## 2022-03-05 MED ORDER — LIDOCAINE HCL (CARDIAC) PF 100 MG/5ML IV SOSY
PREFILLED_SYRINGE | INTRAVENOUS | Status: DC | PRN
  Administered 2022-03-05: 40 mg via INTRAVENOUS

## 2022-03-05 MED ORDER — PROPOFOL 10 MG/ML IV BOLUS
INTRAVENOUS | Status: DC | PRN
  Administered 2022-03-05: 100 mg via INTRAVENOUS
  Administered 2022-03-05 (×5): 40 mg via INTRAVENOUS

## 2022-03-05 NOTE — Transfer of Care (Signed)
Immediate Anesthesia Transfer of Care Note  Patient: Jackie Baker  Procedure(s) Performed: COLONOSCOPY WITH PROPOFOL  Patient Location: Endoscopy Unit  Anesthesia Type:General  Level of Consciousness: drowsy  Airway & Oxygen Therapy: Patient Spontanous Breathing and Patient connected to nasal cannula oxygen  Post-op Assessment: Report given to RN and Post -op Vital signs reviewed and stable  Post vital signs: Reviewed and stable  Last Vitals:  Vitals Value Taken Time  BP 98/58 03/05/22 1025  Temp 35.6 C 03/05/22 1025  Pulse 64 03/05/22 1025  Resp 16 03/05/22 1025  SpO2 97 % 03/05/22 1025    Last Pain:  Vitals:   03/05/22 1025  TempSrc: Temporal  PainSc:          Complications: No notable events documented.

## 2022-03-05 NOTE — Anesthesia Postprocedure Evaluation (Signed)
Anesthesia Post Note  Patient: Jackie Baker  Procedure(s) Performed: COLONOSCOPY WITH PROPOFOL  Patient location during evaluation: Endoscopy Anesthesia Type: General Level of consciousness: awake and alert Pain management: pain level controlled Vital Signs Assessment: post-procedure vital signs reviewed and stable Respiratory status: spontaneous breathing, nonlabored ventilation, respiratory function stable and patient connected to nasal cannula oxygen Cardiovascular status: blood pressure returned to baseline and stable Postop Assessment: no apparent nausea or vomiting Anesthetic complications: no   No notable events documented.   Last Vitals:  Vitals:   03/05/22 1035 03/05/22 1045  BP: 105/62 (!) 153/69  Pulse: 66 60  Resp: 15 13  Temp:    SpO2: 100% 100%    Last Pain:  Vitals:   03/05/22 1025  TempSrc: Temporal  PainSc:                  [name]

## 2022-03-05 NOTE — Anesthesia Preprocedure Evaluation (Addendum)
Anesthesia Evaluation  Patient identified by MRN, date of birth, ID band Patient awake    Reviewed: Allergy & Precautions, NPO status , Patient's Chart, lab work & pertinent test results  History of Anesthesia Complications Negative for: history of anesthetic complications  Airway Mallampati: III  TM Distance: <3 FB Neck ROM: full    Dental  (+) Chipped, Poor Dentition, Missing   Pulmonary neg pulmonary ROS, neg shortness of breath   Pulmonary exam normal        Cardiovascular Exercise Tolerance: Good (-) angina negative cardio ROS Normal cardiovascular exam     Neuro/Psych negative neurological ROS  negative psych ROS   GI/Hepatic Neg liver ROS,GERD  Controlled,,  Endo/Other  negative endocrine ROS    Renal/GU negative Renal ROS  negative genitourinary   Musculoskeletal   Abdominal   Peds  Hematology negative hematology ROS (+)   Anesthesia Other Findings Past Medical History: No date: Arrhythmia     Comment:  PVCs No date: GERD (gastroesophageal reflux disease) No date: Osteoporosis  Past Surgical History: 08/06/2016: CATARACT EXTRACTION W/PHACO; Right     Comment:  Procedure: CATARACT EXTRACTION PHACO AND INTRAOCULAR               LENS PLACEMENT ([place])  Right;  Surgeon: Eulogio Bear, MD;  Location: [place];  Service:               Ophthalmology;  Laterality: Right; 09/03/2016: CATARACT EXTRACTION W/PHACO; Left     Comment:  Procedure: CATARACT EXTRACTION PHACO AND INTRAOCULAR               LENS PLACEMENT ([place]) Left;  Surgeon: Eulogio Bear,               MD;  Location: [place];  Service:               Ophthalmology;  Laterality: Left; No date: COLONOSCOPY 07/22/2017: COLONOSCOPY WITH PROPOFOL; N/A     Comment:  Procedure: COLONOSCOPY WITH PROPOFOL;  Surgeon:               Lollie Sails, MD;  Location: [facility] ENDOSCOPY;                Service: Endoscopy;   Laterality: N/A; No date: EYE SURGERY No date: TONSILLECTOMY  BMI    Body Mass Index: 31.10 kg/m      Reproductive/Obstetrics negative OB ROS                             Anesthesia Physical Anesthesia Plan  ASA: 2  Anesthesia Plan: General   Post-op Pain Management:    Induction: Intravenous  PONV Risk Score and Plan: Propofol infusion and TIVA  Airway Management Planned: Natural Airway and Nasal Cannula  Additional Equipment:   Intra-op Plan:   Post-operative Plan:   Informed Consent: I have reviewed the patients History and Physical, chart, labs and discussed the procedure including the risks, benefits and alternatives for the proposed anesthesia with the patient or authorized representative who has indicated his/her understanding and acceptance.     Dental Advisory Given  Plan Discussed with: Anesthesiologist, CRNA and Surgeon  Anesthesia Plan Comments: (Patient consented for risks of anesthesia including but not limited to:  - adverse reactions to medications - risk of airway placement if required - damage to eyes, teeth, lips  or other oral mucosa - nerve damage due to positioning  - sore throat or hoarseness - Damage to heart, brain, nerves, lungs, other parts of body or loss of life  Patient voiced understanding.)       Anesthesia Quick Evaluation

## 2022-03-05 NOTE — Op Note (Signed)
[facility] Patient Name: Jackie Baker Procedure Date: 03/05/2022 9:57 AM MRN: [id number] Account #: [id number] Date of Birth: [birth year] Admit Type: Outpatient Age: 77 Room: [facility] ENDO ROOM 1 Gender: Female Note Status: Finalized Instrument Name: Jasper Riling [id number] Procedure:             Colonoscopy Indications:           Surveillance: Personal history of adenomatous polyps                         on last colonoscopy 5 years ago Providers:             Andrey Farmer MD, MD Medicines:             Monitored Anesthesia Care Complications:         No immediate complications. Estimated blood loss:                         Minimal. Procedure:             Pre-Anesthesia Assessment:                        - Prior to the procedure, a History and Physical was                         performed, and patient medications and allergies were                         reviewed. The patient is competent. The risks and                         benefits of the procedure and the sedation options and                         risks were discussed with the patient. All questions                         were answered and informed consent was obtained.                         Patient identification and proposed procedure were                         verified by the physician, the nurse, the                         anesthesiologist, the anesthetist and the technician                         in the endoscopy suite. Mental Status Examination:                         alert and oriented. Airway Examination: normal                         oropharyngeal airway and neck mobility. Respiratory                         Examination: clear to auscultation. CV Examination:  normal. Prophylactic Antibiotics: The patient does not                         require prophylactic antibiotics. Prior                         Anticoagulants: The patient has taken no anticoagulant                          or antiplatelet agents. ASA Grade Assessment: II - A                         patient with mild systemic disease. After reviewing                         the risks and benefits, the patient was deemed in                         satisfactory condition to undergo the procedure. The                         anesthesia plan was to use monitored anesthesia care                         (MAC). Immediately prior to administration of                         medications, the patient was re-assessed for adequacy                         to receive sedatives. The heart rate, respiratory                         rate, oxygen saturations, blood pressure, adequacy of                         pulmonary ventilation, and response to care were                         monitored throughout the procedure. The physical                         status of the patient was re-assessed after the                         procedure.                        After obtaining informed consent, the colonoscope was                         passed under direct vision. Throughout the procedure,                         the patient's blood pressure, pulse, and oxygen                         saturations were monitored continuously. The  Colonoscope was introduced through the anus and                         advanced to the the cecum, identified by appendiceal                         orifice and ileocecal valve. The colonoscopy was                         performed without difficulty. The patient tolerated                         the procedure well. The quality of the bowel                         preparation was good. The ileocecal valve, appendiceal                         orifice, and rectum were photographed. Findings:      The perianal and digital rectal examinations were normal.      There was a small lipoma, in the ascending colon.      A 3 mm polyp was found in the hepatic flexure.  The polyp was sessile.       The polyp was removed with a cold snare. Resection and retrieval were       complete. Estimated blood loss was minimal.      A few small-mouthed diverticula were found in the sigmoid colon.      Internal hemorrhoids were found during retroflexion. The hemorrhoids       were Grade I (internal hemorrhoids that do not prolapse).      The exam was otherwise without abnormality on direct and retroflexion       views. Impression:            - Small lipoma in the ascending colon.                        - One 3 mm polyp at the hepatic flexure, removed with                         a cold snare. Resected and retrieved.                        - Diverticulosis in the sigmoid colon.                        - Internal hemorrhoids.                        - The examination was otherwise normal on direct and                         retroflexion views. Recommendation:        - Discharge patient to home.                        - Resume previous diet.                        - Continue present medications.                        -  Await pathology results.                        - Repeat colonoscopy is not recommended due to current                         age (2 years or older) for surveillance.                        - Return to referring physician as previously                         scheduled. Procedure Code(s):     --- Professional ---                        [phone], Colonoscopy, flexible; with removal of                         tumor(s), polyp(s), or other lesion(s) by snare                         technique Diagnosis Code(s):     --- Professional ---                        Z86.010, Personal history of colonic polyps                        D17.5, Benign lipomatous neoplasm of intra-abdominal                         organs                        D12.3, Benign neoplasm of transverse colon (hepatic                         flexure or splenic flexure)                         K64.0, First degree hemorrhoids                        K57.30, Diverticulosis of large intestine without                         perforation or abscess without bleeding CPT copyright 2022 [employer]. All rights reserved. The codes documented in this report are preliminary and upon coder review may  be revised to meet current compliance requirements. Andrey Farmer MD, MD 03/05/2022 10:25:39 AM Number of Addenda: 0 Note Initiated On: 03/05/2022 9:57 AM Scope Withdrawal Time: 0 hours 7 minutes 25 seconds  Total Procedure Duration: 0 hours 13 minutes 29 seconds  Estimated Blood Loss:  Estimated blood loss was minimal.      [facility]

## 2022-03-05 NOTE — H&P (Signed)
Outpatient short stay form Pre-procedure 03/05/2022  Lesly Rubenstein, MD  Primary Physician: Derinda Late, MD  Reason for visit:  Surveillance colonoscopy  History of present illness:    77 y/o lady with history of GERD and osteoporosis here for surveillance colonoscopy. Last colonoscopy in 2019 with three small Ta's. No blood thinners. No family history of GI malignancies. No significant abdominal surgeries.    Current Facility-Administered Medications:    0.9 %  sodium chloride infusion, , Intravenous, Continuous, Hanae Waiters, Hilton Cork, MD, Last Rate: 20 mL/hr at 03/05/22 0942, New Bag at 03/05/22 0942  Medications Prior to Admission  Medication Sig Dispense Refill Last Dose   aspirin EC 81 MG tablet Take 81 mg by mouth daily.   03/04/2022   Calcium Carb-Cholecalciferol (CALCIUM 1000 + D PO) Take by mouth.   Past Week   Multiple Vitamins-Minerals (WOMENS MULTIVITAMIN PO) Take by mouth.   Past Week   omeprazole (PRILOSEC) 40 MG capsule Take 40 mg by mouth daily.   03/04/2022   raloxifene (EVISTA) 60 MG tablet Take 60 mg by mouth daily.   03/04/2022   ibandronate (BONIVA) 150 MG tablet Take 150 mg by mouth every 30 (thirty) days. Take in the morning with a full glass of water, on an empty stomach, and do not take anything else by mouth or lie down for the next 30 min.        No Known Allergies   Past Medical History:  Diagnosis Date   Arrhythmia    PVCs   GERD (gastroesophageal reflux disease)    Osteoporosis     Review of systems:  Otherwise negative.    Physical Exam  Gen: Alert, oriented. Appears stated age.  HEENT: PERRLA. Lungs: No respiratory distress CV: RRR Abd: soft, benign, no masses Ext: No edema    Planned procedures: Proceed with colonoscopy. The patient understands the nature of the planned procedure, indications, risks, alternatives and potential complications including but not limited to bleeding, infection, perforation, damage to internal organs and  possible oversedation/side effects from anesthesia. The patient agrees and gives consent to proceed.  Please refer to procedure notes for findings, recommendations and patient disposition/instructions.     Lesly Rubenstein, MD [facility]enter[facility]

## 2022-03-05 NOTE — Interval H&P Note (Signed)
History and Physical Interval Note:  03/05/2022 10:00 AM  Jackie Baker  has presented today for surgery, with the diagnosis of HX OF ADENOMATOUS POLYP OF COLON.  The various methods of treatment have been discussed with the patient and family. After consideration of risks, benefits and other options for treatment, the patient has consented to  Procedure(s): COLONOSCOPY WITH PROPOFOL (N/A) as a surgical intervention.  The patient's history has been reviewed, patient examined, no change in status, stable for surgery.  I have reviewed the patient's chart and labs.  Questions were answered to the patient's satisfaction.     Lesly Rubenstein  Ok to proceed with colonoscopy

## 2022-03-06 ENCOUNTER — Encounter: Payer: Self-pay | Admitting: Gastroenterology

## 2022-03-06 LAB — SURGICAL PATHOLOGY

## 2022-09-16 ENCOUNTER — Other Ambulatory Visit: Payer: Self-pay | Admitting: Family Medicine

## 2022-09-16 DIAGNOSIS — Z1231 Encounter for screening mammogram for malignant neoplasm of breast: Secondary | ICD-10-CM

## 2022-11-01 ENCOUNTER — Ambulatory Visit
Admission: RE | Admit: 2022-11-01 | Discharge: 2022-11-01 | Disposition: A | Discharge status: Home / Self Care | Payer: Medicare Other | Source: Ambulatory Visit | Attending: Family Medicine | Admitting: Family Medicine

## 2022-11-01 DIAGNOSIS — Z1231 Encounter for screening mammogram for malignant neoplasm of breast: Secondary | ICD-10-CM | POA: Insufficient documentation

## 2023-09-18 ENCOUNTER — Other Ambulatory Visit: Payer: Self-pay | Admitting: Family Medicine

## 2023-09-18 DIAGNOSIS — Z79899 Other long term (current) drug therapy: Secondary | ICD-10-CM | POA: Diagnosis not present

## 2023-09-18 DIAGNOSIS — N1831 Chronic kidney disease, stage 3a: Secondary | ICD-10-CM | POA: Diagnosis not present

## 2023-09-18 DIAGNOSIS — E7800 Pure hypercholesterolemia, unspecified: Secondary | ICD-10-CM | POA: Diagnosis not present

## 2023-09-18 DIAGNOSIS — Z1231 Encounter for screening mammogram for malignant neoplasm of breast: Secondary | ICD-10-CM

## 2023-09-18 DIAGNOSIS — M810 Age-related osteoporosis without current pathological fracture: Secondary | ICD-10-CM | POA: Diagnosis not present

## 2023-09-25 DIAGNOSIS — Z79899 Other long term (current) drug therapy: Secondary | ICD-10-CM | POA: Diagnosis not present

## 2023-09-25 DIAGNOSIS — N1831 Chronic kidney disease, stage 3a: Secondary | ICD-10-CM | POA: Diagnosis not present

## 2023-09-25 DIAGNOSIS — K219 Gastro-esophageal reflux disease without esophagitis: Secondary | ICD-10-CM | POA: Diagnosis not present

## 2023-09-25 DIAGNOSIS — Z1331 Encounter for screening for depression: Secondary | ICD-10-CM | POA: Diagnosis not present

## 2023-09-25 DIAGNOSIS — Z0000 Encounter for general adult medical examination without abnormal findings: Secondary | ICD-10-CM | POA: Diagnosis not present

## 2023-09-25 DIAGNOSIS — M810 Age-related osteoporosis without current pathological fracture: Secondary | ICD-10-CM | POA: Diagnosis not present

## 2023-09-25 DIAGNOSIS — E7800 Pure hypercholesterolemia, unspecified: Secondary | ICD-10-CM | POA: Diagnosis not present

## 2023-11-04 ENCOUNTER — Inpatient Hospital Stay
Admission: RE | Admit: 2023-11-04 | Discharge: 2023-11-04 | Discharge status: Home / Self Care | Source: Ambulatory Visit | Attending: Family Medicine | Admitting: Family Medicine

## 2023-11-04 DIAGNOSIS — Z1231 Encounter for screening mammogram for malignant neoplasm of breast: Secondary | ICD-10-CM | POA: Insufficient documentation

## 2023-12-23 DIAGNOSIS — H26491 Other secondary cataract, right eye: Secondary | ICD-10-CM | POA: Diagnosis not present

## 2023-12-23 DIAGNOSIS — H43813 Vitreous degeneration, bilateral: Secondary | ICD-10-CM | POA: Diagnosis not present

## 2023-12-23 DIAGNOSIS — Z961 Presence of intraocular lens: Secondary | ICD-10-CM | POA: Diagnosis not present
